# Patient Record
Sex: Female | Born: 1965 | Race: White | Hispanic: No | Marital: Married | Smoking: Former smoker
Health system: Southern US, Community
[De-identification: ages and names within clinical notes are randomized; demographics above are authoritative.]

---

## 1999-03-10 ENCOUNTER — Other Ambulatory Visit: Admission: RE | Admit: 1999-03-10 | Discharge: 1999-03-10 | Payer: Self-pay | Admitting: Obstetrics and Gynecology

## 1999-10-17 ENCOUNTER — Encounter: Admission: RE | Admit: 1999-10-17 | Discharge: 1999-10-17 | Payer: Self-pay | Admitting: Obstetrics and Gynecology

## 1999-10-17 ENCOUNTER — Encounter: Payer: Self-pay | Admitting: Obstetrics and Gynecology

## 1999-11-11 ENCOUNTER — Ambulatory Visit (HOSPITAL_COMMUNITY): Admission: RE | Admit: 1999-11-11 | Discharge: 1999-11-11 | Payer: Self-pay | Admitting: Obstetrics and Gynecology

## 1999-11-11 ENCOUNTER — Encounter (INDEPENDENT_AMBULATORY_CARE_PROVIDER_SITE_OTHER): Payer: Self-pay | Admitting: Specialist

## 2000-05-31 ENCOUNTER — Other Ambulatory Visit: Admission: RE | Admit: 2000-05-31 | Discharge: 2000-05-31 | Payer: Self-pay | Admitting: Obstetrics and Gynecology

## 2000-08-07 ENCOUNTER — Encounter: Payer: Self-pay | Admitting: Emergency Medicine

## 2000-08-07 ENCOUNTER — Emergency Department (HOSPITAL_COMMUNITY): Admission: EM | Admit: 2000-08-07 | Discharge: 2000-08-07 | Payer: Self-pay | Admitting: Emergency Medicine

## 2001-07-18 ENCOUNTER — Other Ambulatory Visit: Admission: RE | Admit: 2001-07-18 | Discharge: 2001-07-18 | Payer: Self-pay | Admitting: Obstetrics and Gynecology

## 2002-08-03 ENCOUNTER — Other Ambulatory Visit: Admission: RE | Admit: 2002-08-03 | Discharge: 2002-08-03 | Payer: Self-pay | Admitting: Obstetrics and Gynecology

## 2004-07-25 ENCOUNTER — Other Ambulatory Visit: Admission: RE | Admit: 2004-07-25 | Discharge: 2004-07-25 | Payer: Self-pay | Admitting: Obstetrics and Gynecology

## 2005-07-02 ENCOUNTER — Encounter (INDEPENDENT_AMBULATORY_CARE_PROVIDER_SITE_OTHER): Payer: Self-pay | Admitting: Internal Medicine

## 2005-11-01 ENCOUNTER — Emergency Department (HOSPITAL_COMMUNITY): Admission: EM | Admit: 2005-11-01 | Discharge: 2005-11-01 | Payer: Self-pay | Admitting: Emergency Medicine

## 2005-11-17 ENCOUNTER — Ambulatory Visit: Payer: Self-pay | Admitting: Family Medicine

## 2006-01-20 ENCOUNTER — Ambulatory Visit: Payer: Self-pay | Admitting: Family Medicine

## 2006-09-21 ENCOUNTER — Ambulatory Visit (HOSPITAL_COMMUNITY): Admission: RE | Admit: 2006-09-21 | Discharge: 2006-09-21 | Payer: Self-pay | Admitting: Obstetrics and Gynecology

## 2006-10-15 ENCOUNTER — Encounter: Admission: RE | Admit: 2006-10-15 | Discharge: 2006-10-15 | Payer: Self-pay | Admitting: Gastroenterology

## 2007-02-08 ENCOUNTER — Emergency Department (HOSPITAL_COMMUNITY): Admission: EM | Admit: 2007-02-08 | Discharge: 2007-02-08 | Payer: Self-pay | Admitting: Emergency Medicine

## 2007-02-11 ENCOUNTER — Ambulatory Visit: Payer: Self-pay | Admitting: Family Medicine

## 2007-02-11 DIAGNOSIS — G510 Bell's palsy: Secondary | ICD-10-CM | POA: Insufficient documentation

## 2007-02-11 DIAGNOSIS — J069 Acute upper respiratory infection, unspecified: Secondary | ICD-10-CM | POA: Insufficient documentation

## 2007-02-11 DIAGNOSIS — H5310 Unspecified subjective visual disturbances: Secondary | ICD-10-CM | POA: Insufficient documentation

## 2007-11-08 ENCOUNTER — Encounter (INDEPENDENT_AMBULATORY_CARE_PROVIDER_SITE_OTHER): Payer: Self-pay | Admitting: Internal Medicine

## 2008-09-19 ENCOUNTER — Ambulatory Visit: Payer: Self-pay | Admitting: Family Medicine

## 2008-09-20 ENCOUNTER — Telehealth (INDEPENDENT_AMBULATORY_CARE_PROVIDER_SITE_OTHER): Payer: Self-pay | Admitting: Internal Medicine

## 2008-09-21 ENCOUNTER — Encounter (INDEPENDENT_AMBULATORY_CARE_PROVIDER_SITE_OTHER): Payer: Self-pay | Admitting: Internal Medicine

## 2008-09-21 DIAGNOSIS — E669 Obesity, unspecified: Secondary | ICD-10-CM

## 2008-09-21 DIAGNOSIS — F329 Major depressive disorder, single episode, unspecified: Secondary | ICD-10-CM

## 2008-09-21 DIAGNOSIS — F411 Generalized anxiety disorder: Secondary | ICD-10-CM | POA: Insufficient documentation

## 2009-12-24 ENCOUNTER — Emergency Department (HOSPITAL_COMMUNITY)
Admission: EM | Admit: 2009-12-24 | Discharge: 2009-12-24 | Payer: Self-pay | Source: Home / Self Care | Admitting: Family Medicine

## 2009-12-24 ENCOUNTER — Emergency Department (HOSPITAL_COMMUNITY): Admission: EM | Admit: 2009-12-24 | Discharge: 2009-12-25 | Payer: Self-pay | Admitting: Emergency Medicine

## 2009-12-27 ENCOUNTER — Inpatient Hospital Stay (HOSPITAL_COMMUNITY)
Admission: RE | Admit: 2009-12-27 | Discharge: 2009-12-28 | Payer: Self-pay | Source: Home / Self Care | Admitting: Specialist

## 2010-07-18 LAB — URINALYSIS, ROUTINE W REFLEX MICROSCOPIC
Nitrite: NEGATIVE
Urobilinogen, UA: 0.2 mg/dL (ref 0.0–1.0)

## 2010-07-18 LAB — COMPREHENSIVE METABOLIC PANEL
AST: 25 U/L (ref 0–37)
Alkaline Phosphatase: 95 U/L (ref 39–117)
Calcium: 9.1 mg/dL (ref 8.4–10.5)
Chloride: 105 mEq/L (ref 96–112)
Creatinine, Ser: 0.97 mg/dL (ref 0.4–1.2)
GFR calc non Af Amer: >60 mL/min (ref >60)
Glucose, Bld: 96 mg/dL (ref 70–99)
Sodium: 139 mEq/L (ref 135–145)
Total Protein: 6.1 g/dL (ref 6.0–8.3)

## 2010-07-18 LAB — CBC
Hemoglobin: 14 g/dL (ref 12.0–15.0)
RBC: 4.58 MIL/uL (ref 3.87–5.11)
RDW: 12.9 % (ref 11.5–15.5)

## 2010-07-18 LAB — SURGICAL PCR SCREEN: MRSA, PCR: NEGATIVE

## 2010-07-18 LAB — DIFFERENTIAL
Basophils Relative: 0 % (ref 0–1)
Eosinophils Relative: 1 % (ref 0–5)
Lymphocytes Relative: 18 % (ref 12–46)
Lymphs Abs: 2 10*3/uL (ref 0.7–4.0)
Neutro Abs: 8.4 10*3/uL — ABNORMAL HIGH (ref 1.7–7.7)
Neutrophils Relative %: 74 % (ref 43–77)

## 2010-09-19 NOTE — H&P (Signed)
[facility]  Patient:    Kathleen Oconnor, Kathleen Oconnor                       MRN: [id number] Adm. Date:  11/11/99 Attending:  Duke Salvia. Marcelle Overlie, M.D.                         History and Physical  CHIEF COMPLAINT:  Chronic pelvic pain.  HISTORY OF PRESENT ILLNESS:  The patient is a 45 year old G1, P1 with prior TAH/RSO who has had chronic left pelvic pain since March 10, 1999.  Recently, this has not responded to Vioxx, and has required narcotics for relief.  She did have an  ultrasound dated October 17, 1999 at [facility] that showed multiple small left ovarian cysts, the largest being 3.2 cm, appearing to be a simple cyst. Several other smaller in the 1.9 range.  We have discussed the possibility of adnexal adhesions and, due to the severity of the pain, she prefers to have definitive SO. The procedure of laparoscopic LSO with the possible need for open LSO were discussed with her.  This procedure including the risks relative to bleeding, infection, adjacent organ injury and her expected recovery time have been discussed.  She has already been on Estratabs because of some menopausal symptoms that happened after her TAH/RSO in 1998 and also to help to try to improve libido, so she understands the need for continued ERT after the removal of her remaining ovary.  ALLERGIES:  None.  PAST SURGICAL HISTORY:  Laparotomy with myomectomy.  Previous laparoscopy. TAH/RSO in 1998.  REVIEW OF SYSTEMS:  Otherwise unremarkable.  In 1998, the left ovary appeared to be normal and was conserved.  The right was removed because of dense adnexal adhesions at the time of hysterectomy.  PHYSICAL EXAMINATION:  VITAL SIGNS:  Temperature 98.2, blood pressure 110/60.  HEENT:  Unremarkable.  NECK:  Supple without masses.  LUNGS:  Clear.  CARDIOVASCULAR:  Regular rate and rhythm without murmurs, rubs, or gallops.  BREASTS:  Negative.  ABDOMEN:  Soft, flat and  nontender.  PELVIC:  Normal external genitalia.  The vaginal cuff was clear.  Bimanual revealed some tenderness on the left side.  No mass.  EXTREMITIES:  Unremarkable.  NEUROLOGIC:  Unremarkable.  IMPRESSION: 1. Chronic pelvic pain. 2. History of endometriosis with previous myomectomy, previous total abdominal    hysterectomy with right salpingo-oophorectomy for pelvic pain.  PLAN:  Diagnostic laparoscopy with LSO.  The procedure and risks were reviewed s above. DD:  11/10/99 TD:  11/11/99 Job: [id number]

## 2010-09-19 NOTE — Assessment & Plan Note (Signed)
[facility]                                 ON-CALL NOTE   NAME:Kathleen Oconnor, Kathleen Oconnor                        MRN:          [id number]  DATE:02/12/2007                            DOB:          1965-07-16    Patient of Dr. Hetty Ely   CALLER:  Mr. Artis Flock.   PHONE NUMBER:  [phone].   SUBJECTIVE:  Cough, congestion.  Cough keeping her up all night.   ASSESSMENT AND PLAN:  Suggested going to urgent care if symptoms are  severe.  [facility] is now [facility].  Will call in a prescription for  codeine with guaifenesin 6 ounces 0 refills to treat her cough until she  can be seen by a physician.     Kerby Nora, MD  Electronically Signed    AB/MedQ  DD: 02/12/2007  DT: 02/12/2007  Job #: [id number]
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
## 2010-09-19 NOTE — Op Note (Signed)
[facility]  Patient:    Kathleen Oconnor, Kathleen Oconnor                     MRN: [id number] Proc. Date: 11/11/99 Adm. Date:  [id number] Attending:  Rhina Brackett                           Operative Report  PREOPERATIVE DIAGNOSIS:  Left ovarian cyst, chronic pelvic pain.  POSTOPERATIVE DIAGNOSIS:  Left ovarian cyst, chronic pelvic pain.  OPERATION PERFORMED:  Diagnostic laparoscopy with left salpingo-oophorectomy.  SURGEON:  Duke Salvia. Marcelle Overlie, M.D.  ANESTHESIA:  General endotracheal.  COMPLICATIONS:  None.  DRAINS:  In and out Foley catheter.  ESTIMATED BLOOD LOSS:  Less than 5 cc.  DESCRIPTION OF PROCEDURE:  The patient was taken to the operating room.  After an adequate level of general endotracheal anesthesia was obtained with the legs in stirrups.  The abdomen, perineum and vagina were prepped and draped in the usual sterile manner for laparoscopy.  The bladder was drained. Examination under anesthesia carried out.  The uterus and right tube were surgically absent.  I could palpate a 2 or 3 cm left mobile ovary.  Attention directed to the abdomen where a 2 cm subumbilical incision was made.  The Veress needle was introduced without difficulty.  Its intra-abdominal position was verified by pressure and water testing.  After a 2L pneumoperitoneum was then created.  After a 2L pneumoperitoneum was then created, a laparoscopic trocar and sleeve were then introduced without difficulty.  A 12 mm disposable was used.  Two fingerbreadths above the symphysis and the midline under direct visualization, a second 5 mm trocar was inserted with a blunt probe placed. Patient in Trendelenburg position, pelvic findings as follows.  The uterus and right tube were surgically absent.  There was no evidence of any cul-de-sac adhesions.  No bowel adherent into the cul-de-sac area.  With her in Trendelenburg, the left ovary was noted to be 3 cm, slightly irregular with  some small benign-appearing cyst.  There was no free fluid or blood in the cul-de-sac.  The ovary and tube were otherwise mobile.  A third 5 mm trocar was inserted after transillumination 5 cm lateral and upward from the midline trocar without difficulty and grasping instrument was then used to grasp the tube and ovary and put it on traction toward the midline.  Two Endoloop sutures were then placed around the IP ligament and tied down securely.  This was done after carefully identifying the course of the ureter which was well-below.  Once this was completed, the ovary was excised.  It was cut in half and removed through the 12 mm port without difficulty.  The pelvis was irrigated and aspirated.  The IP ligament pedicle was intact and hemostatic.  The course of the ureter on that side was retraced and noted to be well below the operative site.  No other evidence of endometriosis was noted.  Instruments were removed.  Gas allowed to escape.  The lower incision was closed with subcuticular 4-0 Dexon.  Single 2-0 Dexon was used to close the 12 mm fascia with good closure and then a 4-0 Dexon subcuticular on the skin.  The patient tolerated the procedure well and went to the recovery room in good condition. DD:  11/11/99 TD:  11/11/99 Job: [id number]] [id number]

## 2018-07-21 ENCOUNTER — Other Ambulatory Visit: Payer: Self-pay | Admitting: Obstetrics and Gynecology

## 2018-07-21 DIAGNOSIS — R928 Other abnormal and inconclusive findings on diagnostic imaging of breast: Secondary | ICD-10-CM

## 2018-07-22 ENCOUNTER — Ambulatory Visit
Admission: RE | Admit: 2018-07-22 | Discharge: 2018-07-22 | Disposition: A | Discharge status: Home / Self Care | Payer: 59 | Source: Ambulatory Visit | Attending: Obstetrics and Gynecology | Admitting: Obstetrics and Gynecology

## 2018-07-22 ENCOUNTER — Ambulatory Visit: Payer: Self-pay

## 2018-07-22 ENCOUNTER — Other Ambulatory Visit: Payer: Self-pay

## 2018-07-22 DIAGNOSIS — R928 Other abnormal and inconclusive findings on diagnostic imaging of breast: Secondary | ICD-10-CM

## 2018-07-25 ENCOUNTER — Other Ambulatory Visit: Payer: Self-pay

## 2019-07-08 ENCOUNTER — Ambulatory Visit: Payer: 59 | Attending: Internal Medicine

## 2019-07-08 DIAGNOSIS — Z23 Encounter for immunization: Secondary | ICD-10-CM

## 2019-07-08 NOTE — Progress Notes (Signed)
   Covid-19 Vaccination Clinic  Name:  Kathleen Oconnor    MRN: [id number] DOB: Sep 18, 1965  07/08/2019  Ms. [name] was observed post Covid-19 immunization for 15 minutes without incident. She was provided with Vaccine Information Sheet and instruction to access the V-Safe system.   Ms. [name] was instructed to call 911 with any severe reactions post vaccine: Marland Kitchen Difficulty breathing  . Swelling of face and throat  . A fast heartbeat  . A bad rash all over body  . Dizziness and weakness   Immunizations Administered    Name Date Dose VIS Date Route   Pfizer COVID-19 Vaccine 07/08/2019  5:21 PM 0.3 mL 04/14/2019 Intramuscular   Manufacturer: [employer], [employer]   Lot: [id number]   NDC: [address]-9

## 2019-07-29 ENCOUNTER — Ambulatory Visit: Payer: 59 | Attending: Internal Medicine

## 2019-07-29 DIAGNOSIS — Z23 Encounter for immunization: Secondary | ICD-10-CM

## 2019-07-29 NOTE — Progress Notes (Signed)
   Covid-19 Vaccination Clinic  Name:  Kathleen Oconnor    MRN: [id number] DOB: May 28, 1965  07/29/2019  Kathleen Oconnor was observed post Covid-19 immunization for 15 minutes without incident. She was provided with Vaccine Information Sheet and instruction to access the V-Safe system.   Kathleen Oconnor was instructed to call 911 with any severe reactions post vaccine: Marland Kitchen Difficulty breathing  . Swelling of face and throat  . A fast heartbeat  . A bad rash all over body  . Dizziness and weakness   Immunizations Administered    Name Date Dose VIS Date Route   Pfizer COVID-19 Vaccine 07/29/2019 12:55 PM 0.3 mL 04/14/2019 Intramuscular   Manufacturer: [employer], [employer]   Lot: [id number]   NDC: [address]-0

## 2019-08-08 ENCOUNTER — Ambulatory Visit: Payer: 59

## 2020-10-14 ENCOUNTER — Other Ambulatory Visit: Payer: Self-pay | Admitting: Obstetrics and Gynecology

## 2020-10-14 DIAGNOSIS — R928 Other abnormal and inconclusive findings on diagnostic imaging of breast: Secondary | ICD-10-CM

## 2020-10-18 ENCOUNTER — Ambulatory Visit
Admission: RE | Admit: 2020-10-18 | Discharge: 2020-10-18 | Disposition: A | Discharge status: Home / Self Care | Payer: 59 | Source: Ambulatory Visit | Attending: Obstetrics and Gynecology | Admitting: Obstetrics and Gynecology

## 2020-10-18 ENCOUNTER — Other Ambulatory Visit: Payer: Self-pay

## 2020-10-18 DIAGNOSIS — R928 Other abnormal and inconclusive findings on diagnostic imaging of breast: Secondary | ICD-10-CM

## 2021-01-28 ENCOUNTER — Encounter: Payer: Self-pay | Admitting: Pulmonary Disease

## 2021-01-28 ENCOUNTER — Other Ambulatory Visit: Payer: Self-pay

## 2021-01-28 ENCOUNTER — Ambulatory Visit (INDEPENDENT_AMBULATORY_CARE_PROVIDER_SITE_OTHER): Payer: 59 | Admitting: Pulmonary Disease

## 2021-01-28 VITALS — BP 112/70 | HR 85 | Temp 98.0°F | Ht 66.0 in | Wt 231.6 lb

## 2021-01-28 DIAGNOSIS — R0683 Snoring: Secondary | ICD-10-CM | POA: Diagnosis not present

## 2021-01-28 NOTE — Patient Instructions (Signed)
Moderate probability of significant obstructive sleep apnea  We will schedule you for a home sleep study Update you with results as soon as reviewed  Continue weight loss efforts  Treatment options as we discussed  Tentative follow-up in 3 to 4 months  Sleep Apnea Sleep apnea affects breathing during sleep. It causes breathing to stop for 10 seconds or more, or to become shallow. People with sleep apnea usually snore loudly. It can also increase the risk of: Heart attack. Stroke. Being very overweight (obese). Diabetes. Heart failure. Irregular heartbeat. High blood pressure. The goal of treatment is to help you breathe normally again. What are the causes? The most common cause of this condition is a collapsed or blocked airway. There are three kinds of sleep apnea: Obstructive sleep apnea. This is caused by a blocked or collapsed airway. Central sleep apnea. This happens when the brain does not send the right signals to the muscles that control breathing. Mixed sleep apnea. This is a combination of obstructive and central sleep apnea. What increases the risk? Being overweight. Smoking. Having a small airway. Being older. Being female. Drinking alcohol. Taking medicines to calm yourself (sedatives or tranquilizers). Having family members with the condition. Having a tongue or tonsils that are larger than normal. What are the signs or symptoms? Trouble staying asleep. Loud snoring. Headaches in the morning. Waking up gasping. Dry mouth or sore throat in the morning. Being sleepy or tired during the day. If you are sleepy or tired during the day, you may also: Not be able to focus your mind (concentrate). Forget things. Get angry a lot and have mood swings. Feel sad (depressed). Have changes in your personality. Have less interest in sex, if you are female. Be unable to have an erection, if you are female. How is this treated?  Sleeping on your side. Using a medicine  to get rid of mucus in your nose (decongestant). Avoiding the use of alcohol, medicines to help you relax, or certain pain medicines (narcotics). Losing weight, if needed. Changing your diet. Quitting smoking. Using a machine to open your airway while you sleep, such as: An oral appliance. This is a mouthpiece that shifts your lower jaw forward. A CPAP device. This device blows air through a mask when you breathe out (exhale). An EPAP device. This has valves that you put in each nostril. A BPAP device. This device blows air through a mask when you breathe in (inhale) and breathe out. Having surgery if other treatments do not work. Follow these instructions at home: Lifestyle Make changes that your doctor recommends. Eat a healthy diet. Lose weight if needed. Avoid alcohol, medicines to help you relax, and some pain medicines. Do not smoke or use any products that contain nicotine or tobacco. If you need help quitting, ask your doctor. General instructions Take over-the-counter and prescription medicines only as told by your doctor. If you were given a machine to use while you sleep, use it only as told by your doctor. If you are having surgery, make sure to tell your doctor you have sleep apnea. You may need to bring your device with you. Keep all follow-up visits. Contact a doctor if: The machine that you were given to use during sleep bothers you or does not seem to be working. You do not get better. You get worse. Get help right away if: Your chest hurts. You have trouble breathing in enough air. You have an uncomfortable feeling in your back, arms, or stomach. You  have trouble talking. One side of your body feels weak. A part of your face is hanging down. These symptoms may be an emergency. Get help right away. Call your local emergency services (911 in the [place] Do not wait to see if the symptoms will go away. Do not drive yourself to the hospital. Summary This condition  affects breathing during sleep. The most common cause is a collapsed or blocked airway. The goal of treatment is to help you breathe normally while you sleep. This information is not intended to replace advice given to you by your health care provider. Make sure you discuss any questions you have with your health care provider. Document Revised: 03/29/2020 Document Reviewed: 03/29/2020 Elsevier Patient Education  2022 [employer].

## 2021-01-28 NOTE — Progress Notes (Signed)
Kathleen Oconnor    [id number]    1966/02/14  Primary Care Physician:Williams, Karis Juba, PA-C  Referring Physician: Roger Kill, PA-C [address] [place],  [place] [address]  Chief complaint:   Patient with a history of loud snoring  HPI:  Loud snoring, witnessed apneas Was told about apneas during surgery in 2019  Snoring has gotten worse according to her spouse  Usually tries to go to bed about 9 PM 15 to 30 minutes to fall asleep Wakes up 2-3 times during the night Final wake up time about 5 AM  She has gained about 30 pounds over the last couple years  Reformed smoker  Admits to dryness of her mouth in the morning Occasional sore throat No choking sensations No headaches in the mornings Memory is fine  Multiple family members with snoring Sibling with obstructive sleep apnea on CPAP therapy  Outpatient Encounter Medications as of 01/28/2021  Medication Sig   Estradiol 1 MG/GM GEL    ferrous sulfate 325 (65 FE) MG EC tablet Take 325 mg by mouth 3 (three) times daily with meals.   furosemide (LASIX) 20 MG tablet Take 20 mg by mouth daily.   furosemide (LASIX) 40 MG tablet Take 1 tablet by mouth daily.   Multiple Vitamins-Minerals (ALIVE HAIR, SKIN & NAILS) CHEW Chew by mouth.   Omega-3 Fatty Acids ([employer] NATURAL FISH OIL) 1000 MG CAPS Natural Fish Oil   omega-3 fish oil (MAXEPA) 1000 MG CAPS capsule Take by mouth.   omeprazole (PRILOSEC) 40 MG capsule Take 40 mg by mouth daily.   potassium chloride (KLOR-CON) 10 MEQ tablet Take 10 mEq by mouth daily.   No facility-administered encounter medications on file as of 01/28/2021.    Allergies as of 01/28/2021 - Review Complete 01/28/2021  Allergen Reaction Noted   Morphine Other (See Comments)     History reviewed. No pertinent past medical history.  History reviewed. No pertinent surgical history.  History reviewed. No pertinent family history.  Social History    Socioeconomic History   Marital status: Married    Spouse name: Not on file   Number of children: Not on file   Years of education: Not on file   Highest education level: Not on file  Occupational History   Not on file  Tobacco Use   Smoking status: Former    Packs/day: 6.00    Years: 25.00    Pack years: 150.00    Types: Cigarettes    Quit date: 09/05/2017    Years since quitting: 3.4   Smokeless tobacco: Never   Tobacco comments:    6-7 cigs per day as former smoker -01/28/2021  Substance and Sexual Activity   Alcohol use: Not on file   Drug use: Not on file   Sexual activity: Not on file  Other Topics Concern   Not on file  Social History Narrative   Not on file   Social Determinants of Health   Financial Resource Strain: Not on file  Food Insecurity: Not on file  Transportation Needs: Not on file  Physical Activity: Not on file  Stress: Not on file  Social Connections: Not on file  Intimate Partner Violence: Not on file    Review of Systems  Constitutional: Negative.   Respiratory:  Positive for apnea.   Psychiatric/Behavioral:  Positive for sleep disturbance.    Vitals:   01/28/21 1541  BP: 112/70  Pulse: 85  Temp: 98  F (36.7 C)  SpO2: 97%     Physical Exam Constitutional:      Appearance: She is obese.  HENT:     Head: Normocephalic.     Mouth/Throat:     Mouth: Mucous membranes are moist.     Comments: Mallampati 3, crowded oropharynx Eyes:     Pupils: Pupils are equal, round, and reactive to light.  Cardiovascular:     Rate and Rhythm: Normal rate and regular rhythm.     Heart sounds: No murmur heard.   No friction rub.  Pulmonary:     Effort: No respiratory distress.     Breath sounds: No stridor. No wheezing or rhonchi.  Musculoskeletal:     Cervical back: No rigidity or tenderness.  Neurological:     Mental Status: She is alert.  Psychiatric:        Mood and Affect: Mood normal.   Epworth Sleepiness Scale of 7  Data  Reviewed: No study on record  Assessment:  Moderate quality of significant obstructive sleep apnea  Daytime sleepiness  Nonrestorative sleep  Pathophysiology of sleep disordered breathing discussed with the patient Treatment options discussed Plan/Recommendations: Schedule patient for a home sleep study  Weight loss efforts encouraged  Tentative follow-up in 3 to 4 months  Encouraged to call with any significant concerns   Virl Diamond MD [facility] 01/28/2021, 4:12 PM  CC: Roger Kill, *

## 2021-03-19 NOTE — Telephone Encounter (Signed)
PCC's please advise on how far out the testing is for the HST.  thanks

## 2021-04-11 ENCOUNTER — Ambulatory Visit: Payer: 59 | Admitting: Pulmonary Disease

## 2022-11-18 ENCOUNTER — Other Ambulatory Visit: Payer: Self-pay | Admitting: Obstetrics and Gynecology

## 2022-11-18 DIAGNOSIS — R928 Other abnormal and inconclusive findings on diagnostic imaging of breast: Secondary | ICD-10-CM

## 2022-11-24 ENCOUNTER — Ambulatory Visit
Admission: RE | Admit: 2022-11-24 | Discharge: 2022-11-24 | Disposition: A | Discharge status: Home / Self Care | Payer: Managed Care, Other (non HMO) | Source: Ambulatory Visit | Attending: Obstetrics and Gynecology | Admitting: Obstetrics and Gynecology

## 2022-11-24 ENCOUNTER — Ambulatory Visit: Payer: Self-pay

## 2022-11-24 DIAGNOSIS — R928 Other abnormal and inconclusive findings on diagnostic imaging of breast: Secondary | ICD-10-CM

## 2022-12-03 IMAGING — MG MM DIGITAL DIAGNOSTIC UNILAT*L* W/ TOMO W/ CAD
6 of 12 series · 6 of 36 positions shown · non-contrast
Comparison: Previous exam(s).

CLINICAL DATA: Patient recalled from screening for left breast
asymmetry.

EXAM:
DIGITAL DIAGNOSTIC UNILATERAL LEFT MAMMOGRAM WITH TOMOSYNTHESIS AND
CAD; ULTRASOUND LEFT BREAST LIMITED
TECHNIQUE: Left digital diagnostic mammography and breast tomosynthesis was
performed. The images were evaluated with computer-aided detection.;
Targeted ultrasound examination of the left breast was performed

[L CC synth-2D (1 of 3)]
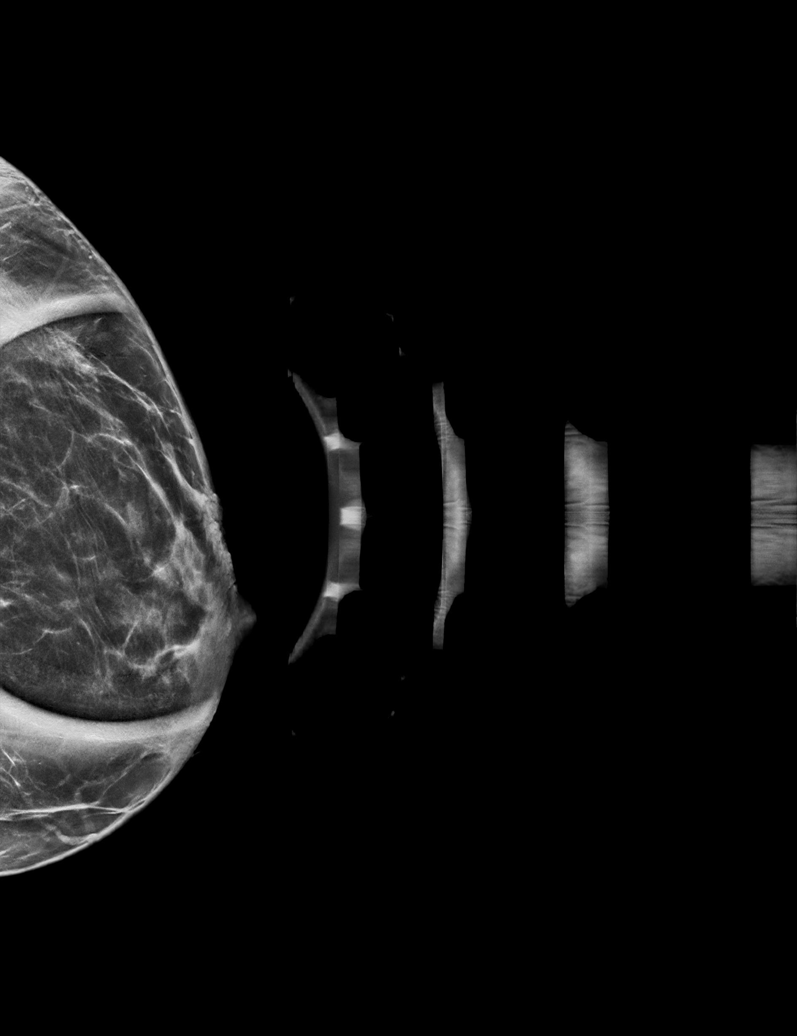

[L CC synth-2D (2 of 3)]
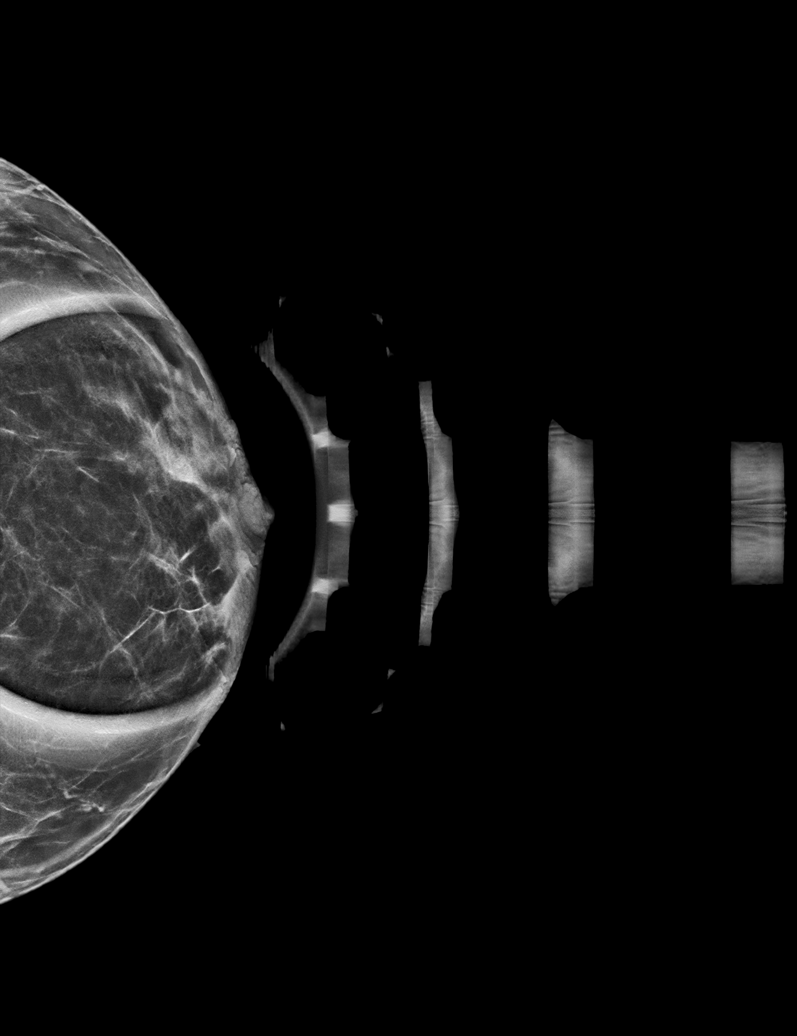

[L MLO synth-2D (1 of 2)]
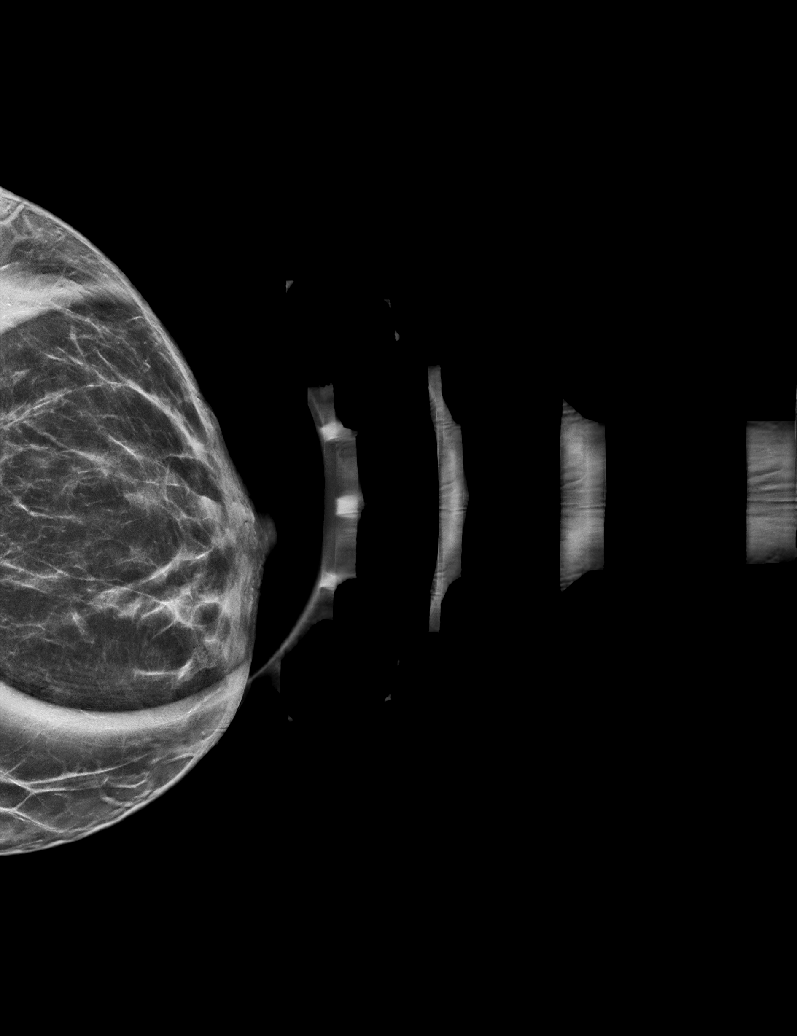

[L ML synth-2D]
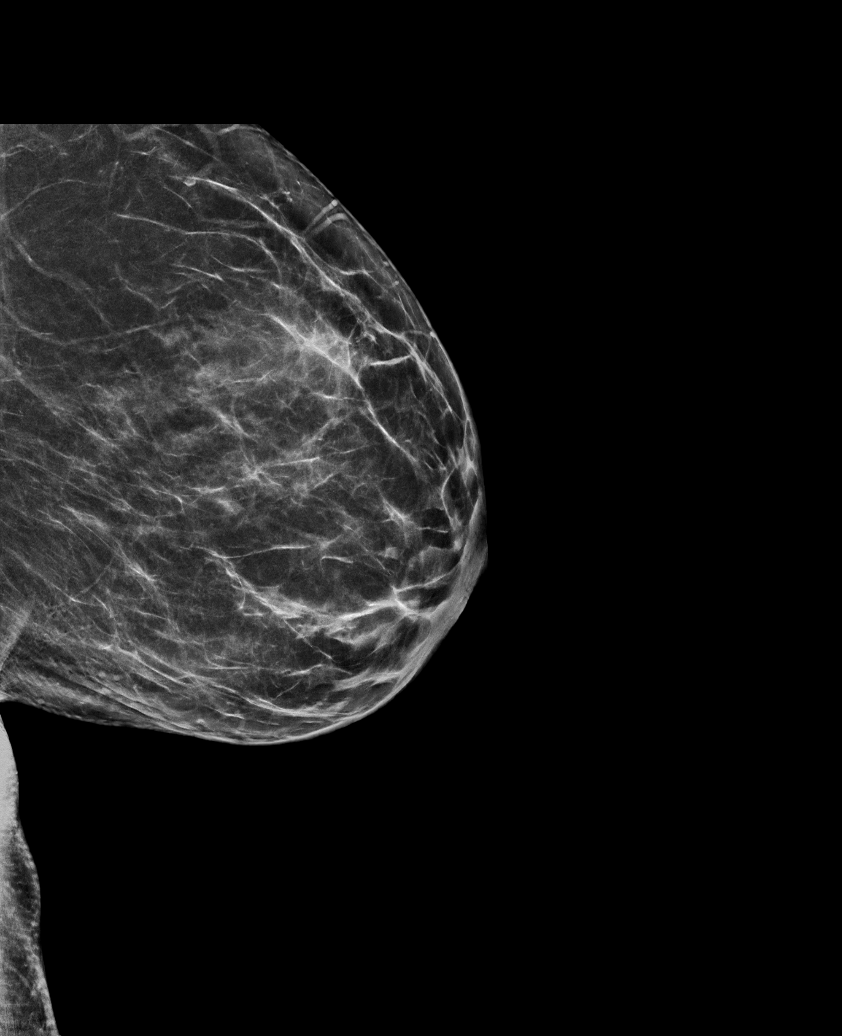

[L CC synth-2D (3 of 3)]
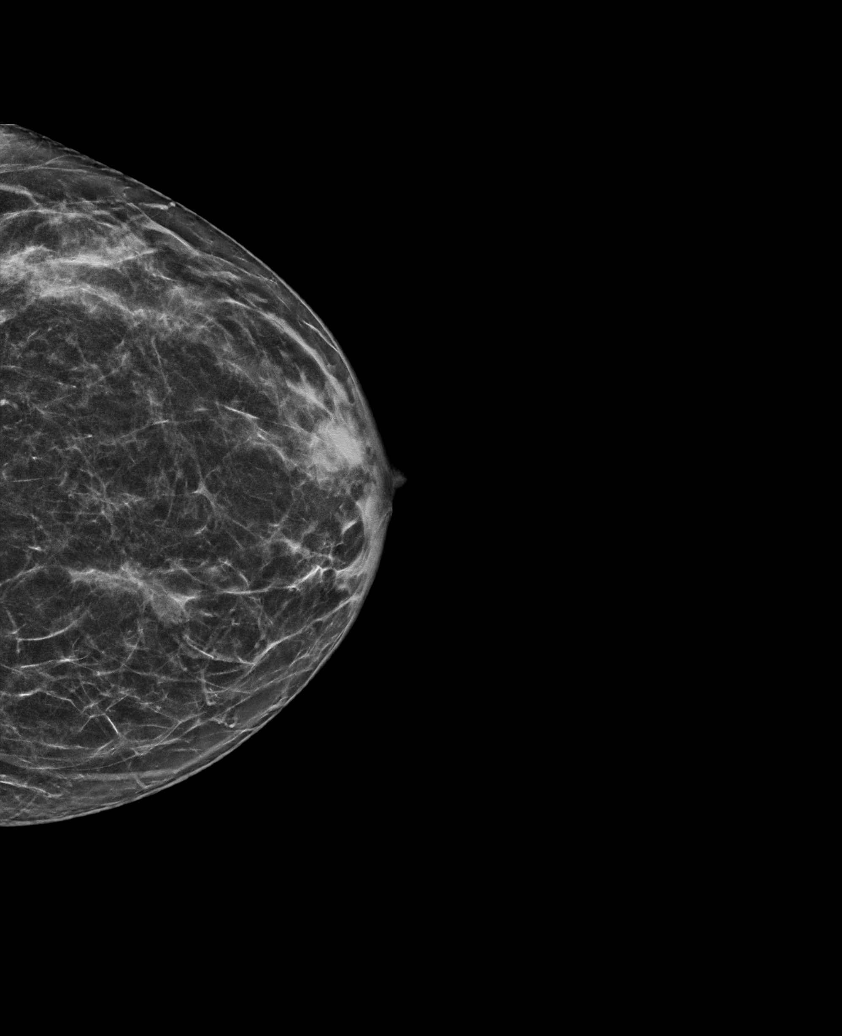

[L MLO synth-2D (2 of 2)]
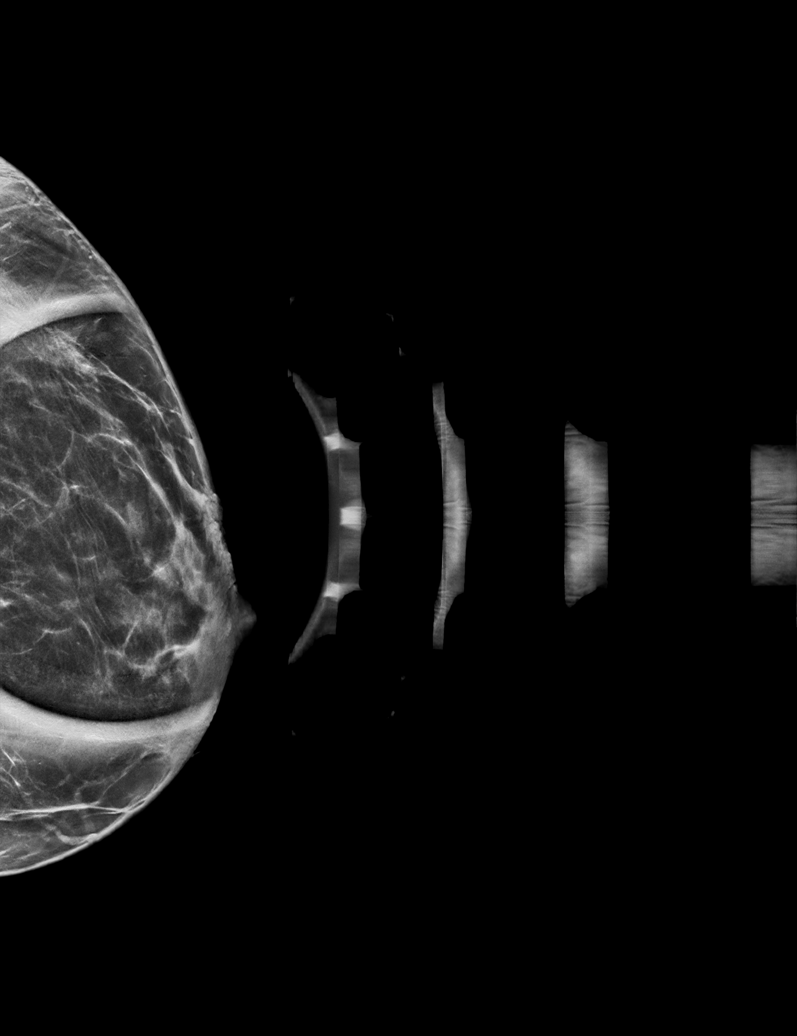

[6 of 36 positions shown; findings below may reference images not displayed]

ACR Breast Density Category b: There are scattered areas of
fibroglandular density.
FINDINGS: Within the anterior left breast laterally there is an asymmetry
which predominately effaces with additional imaging suggestive of
dense fibroglandular tissue.

Targeted ultrasound is performed, showing normal tissue without
suspicious mass within left breast 2 o'clock position 3 cm from the
nipple.
IMPRESSION: No mammographic evidence for malignancy.

RECOMMENDATION:
Screening mammogram in one year.(Code:C7-P-HDS)

I have discussed the findings and recommendations with the patient.
If applicable, a reminder letter will be sent to the patient
regarding the next appointment.

BI-RADS CATEGORY  2: Benign.

## 2023-08-12 ENCOUNTER — Other Ambulatory Visit: Payer: Self-pay | Admitting: Obstetrics and Gynecology

## 2023-08-12 DIAGNOSIS — Z1231 Encounter for screening mammogram for malignant neoplasm of breast: Secondary | ICD-10-CM

## 2023-11-17 ENCOUNTER — Ambulatory Visit
Admission: RE | Admit: 2023-11-17 | Discharge: 2023-11-17 | Disposition: A | Discharge status: Home / Self Care | Source: Ambulatory Visit | Attending: Obstetrics and Gynecology | Admitting: Obstetrics and Gynecology

## 2023-11-17 DIAGNOSIS — Z1231 Encounter for screening mammogram for malignant neoplasm of breast: Secondary | ICD-10-CM
